# Patient Record
Sex: Female | Born: 1990 | Race: Black or African American | Hispanic: No | Marital: Single | Smoking: Never smoker
Health system: Southern US, Community
[De-identification: ages and names within clinical notes are randomized; demographics above are authoritative.]

---
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
## 2009-01-07 ENCOUNTER — Emergency Department (HOSPITAL_BASED_OUTPATIENT_CLINIC_OR_DEPARTMENT_OTHER): Admission: EM | Admit: 2009-01-07 | Discharge: 2009-01-07 | Payer: Self-pay | Admitting: Emergency Medicine

## 2010-02-04 ENCOUNTER — Emergency Department (HOSPITAL_COMMUNITY): Admission: EM | Admit: 2010-02-04 | Discharge: 2010-02-04 | Payer: Self-pay | Admitting: Emergency Medicine

## 2010-02-11 ENCOUNTER — Emergency Department (HOSPITAL_BASED_OUTPATIENT_CLINIC_OR_DEPARTMENT_OTHER): Admission: EM | Admit: 2010-02-11 | Discharge: 2010-02-12 | Payer: Self-pay | Admitting: Emergency Medicine

## 2010-04-02 ENCOUNTER — Emergency Department (HOSPITAL_BASED_OUTPATIENT_CLINIC_OR_DEPARTMENT_OTHER): Admission: EM | Admit: 2010-04-02 | Discharge: 2010-04-02 | Payer: Self-pay | Admitting: Emergency Medicine

## 2010-06-08 ENCOUNTER — Emergency Department (HOSPITAL_BASED_OUTPATIENT_CLINIC_OR_DEPARTMENT_OTHER): Admission: EM | Admit: 2010-06-08 | Discharge: 2010-06-08 | Payer: Self-pay | Admitting: Emergency Medicine

## 2010-07-08 ENCOUNTER — Emergency Department (HOSPITAL_BASED_OUTPATIENT_CLINIC_OR_DEPARTMENT_OTHER): Admission: EM | Admit: 2010-07-08 | Discharge: 2010-07-08 | Payer: Self-pay | Admitting: Emergency Medicine

## 2010-10-24 ENCOUNTER — Emergency Department (INDEPENDENT_AMBULATORY_CARE_PROVIDER_SITE_OTHER)
Admission: EM | Admit: 2010-10-24 | Discharge: 2010-10-24 | Payer: Self-pay | Source: Home / Self Care | Admitting: Emergency Medicine

## 2010-10-24 DIAGNOSIS — S81809A Unspecified open wound, unspecified lower leg, initial encounter: Secondary | ICD-10-CM

## 2010-10-24 DIAGNOSIS — X58XXXA Exposure to other specified factors, initial encounter: Secondary | ICD-10-CM

## 2010-11-05 ENCOUNTER — Emergency Department (HOSPITAL_BASED_OUTPATIENT_CLINIC_OR_DEPARTMENT_OTHER)
Admission: EM | Admit: 2010-11-05 | Discharge: 2010-11-05 | Disposition: A | Discharge status: Home / Self Care | Payer: Medicaid Other | Attending: Emergency Medicine | Admitting: Emergency Medicine

## 2010-11-05 DIAGNOSIS — Z4802 Encounter for removal of sutures: Secondary | ICD-10-CM | POA: Insufficient documentation

## 2010-12-06 LAB — PREGNANCY, URINE: Preg Test, Ur: NEGATIVE

## 2010-12-06 LAB — URINALYSIS, ROUTINE W REFLEX MICROSCOPIC
Bilirubin Urine: NEGATIVE
Ketones, ur: 15 mg/dL — AB
Protein, ur: NEGATIVE mg/dL
Specific Gravity, Urine: 1.026 (ref 1.005–1.030)

## 2010-12-07 LAB — WET PREP, GENITAL
Trich, Wet Prep: NONE SEEN
WBC, Wet Prep HPF POC: NONE SEEN
Yeast Wet Prep HPF POC: NONE SEEN

## 2010-12-07 LAB — GC/CHLAMYDIA PROBE AMP, GENITAL: Chlamydia, DNA Probe: NEGATIVE

## 2010-12-07 LAB — RAPID STREP SCREEN (MED CTR MEBANE ONLY): Streptococcus, Group A Screen (Direct): NEGATIVE

## 2010-12-10 LAB — URINE CULTURE: Colony Count: NO GROWTH

## 2010-12-10 LAB — HERPES SIMPLEX VIRUS CULTURE

## 2010-12-10 LAB — URINALYSIS, ROUTINE W REFLEX MICROSCOPIC
Glucose, UA: NEGATIVE mg/dL
Urobilinogen, UA: 1 mg/dL (ref 0.0–1.0)
pH: 5.5 (ref 5.0–8.0)

## 2010-12-10 LAB — WET PREP, GENITAL: WBC, Wet Prep HPF POC: NONE SEEN

## 2010-12-10 LAB — GC/CHLAMYDIA PROBE AMP, GENITAL: Chlamydia, DNA Probe: NEGATIVE

## 2010-12-11 LAB — GC/CHLAMYDIA PROBE AMP, GENITAL: GC Probe Amp, Genital: NEGATIVE

## 2010-12-11 LAB — WET PREP, GENITAL
Trich, Wet Prep: NONE SEEN
Yeast Wet Prep HPF POC: NONE SEEN

## 2010-12-11 LAB — PREGNANCY, URINE: Preg Test, Ur: NEGATIVE

## 2010-12-12 LAB — URINALYSIS, ROUTINE W REFLEX MICROSCOPIC
Nitrite: NEGATIVE
Urobilinogen, UA: 1 mg/dL (ref 0.0–1.0)

## 2011-01-03 LAB — URINALYSIS, ROUTINE W REFLEX MICROSCOPIC
Glucose, UA: NEGATIVE mg/dL
Nitrite: NEGATIVE
Specific Gravity, Urine: 1.029 (ref 1.005–1.030)

## 2011-01-03 LAB — WET PREP, GENITAL: Yeast Wet Prep HPF POC: NONE SEEN
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
## 2011-11-24 ENCOUNTER — Emergency Department (HOSPITAL_BASED_OUTPATIENT_CLINIC_OR_DEPARTMENT_OTHER)
Admission: EM | Admit: 2011-11-24 | Discharge: 2011-11-24 | Disposition: A | Discharge status: Home / Self Care | Payer: Medicaid Other

## 2011-11-24 NOTE — ED Notes (Signed)
Patient was registered, but when she was being taken to triage she stated that she needed to leave, but she "would be right back".

## 2014-05-14 ENCOUNTER — Emergency Department (HOSPITAL_BASED_OUTPATIENT_CLINIC_OR_DEPARTMENT_OTHER): Payer: Medicaid Other

## 2014-05-14 ENCOUNTER — Encounter (HOSPITAL_BASED_OUTPATIENT_CLINIC_OR_DEPARTMENT_OTHER): Payer: Self-pay | Admitting: Emergency Medicine

## 2014-05-14 ENCOUNTER — Emergency Department (HOSPITAL_BASED_OUTPATIENT_CLINIC_OR_DEPARTMENT_OTHER)
Admission: EM | Admit: 2014-05-14 | Discharge: 2014-05-14 | Disposition: A | Discharge status: Home / Self Care | Payer: Medicaid Other | Attending: Emergency Medicine | Admitting: Emergency Medicine

## 2014-05-14 DIAGNOSIS — S0990XA Unspecified injury of head, initial encounter: Secondary | ICD-10-CM | POA: Diagnosis not present

## 2014-05-14 DIAGNOSIS — Y92410 Unspecified street and highway as the place of occurrence of the external cause: Secondary | ICD-10-CM | POA: Diagnosis not present

## 2014-05-14 DIAGNOSIS — M542 Cervicalgia: Secondary | ICD-10-CM

## 2014-05-14 DIAGNOSIS — S8990XA Unspecified injury of unspecified lower leg, initial encounter: Secondary | ICD-10-CM | POA: Diagnosis not present

## 2014-05-14 DIAGNOSIS — S0993XA Unspecified injury of face, initial encounter: Secondary | ICD-10-CM | POA: Insufficient documentation

## 2014-05-14 DIAGNOSIS — S99929A Unspecified injury of unspecified foot, initial encounter: Secondary | ICD-10-CM

## 2014-05-14 DIAGNOSIS — S199XXA Unspecified injury of neck, initial encounter: Principal | ICD-10-CM

## 2014-05-14 DIAGNOSIS — Y9389 Activity, other specified: Secondary | ICD-10-CM | POA: Insufficient documentation

## 2014-05-14 DIAGNOSIS — IMO0002 Reserved for concepts with insufficient information to code with codable children: Secondary | ICD-10-CM | POA: Diagnosis not present

## 2014-05-14 DIAGNOSIS — S99919A Unspecified injury of unspecified ankle, initial encounter: Secondary | ICD-10-CM

## 2014-05-14 MED ORDER — HYDROCODONE-ACETAMINOPHEN 5-325 MG PO TABS
2.0000 | ORAL_TABLET | Freq: Once | ORAL | Status: AC
  Administered 2014-05-14: 2 via ORAL
  Filled 2014-05-14: qty 2

## 2014-05-14 MED ORDER — CYCLOBENZAPRINE HCL 10 MG PO TABS
5.0000 mg | ORAL_TABLET | Freq: Once | ORAL | Status: AC
  Administered 2014-05-14: 5 mg via ORAL
  Filled 2014-05-14: qty 1

## 2014-05-14 MED ORDER — CYCLOBENZAPRINE HCL 5 MG PO TABS: 10.0000 mg | ORAL_TABLET | Freq: Three times a day (TID) | ORAL | Status: DC | PRN

## 2014-05-14 MED ORDER — HYDROCODONE-ACETAMINOPHEN 5-325 MG PO TABS: 1.0000 | ORAL_TABLET | Freq: Four times a day (QID) | ORAL | Status: DC | PRN

## 2014-05-14 NOTE — Discharge Instructions (Signed)
Cervical Sprain °A cervical sprain is an injury in the neck in which the strong, fibrous tissues (ligaments) that connect your neck bones stretch or tear. Cervical sprains can range from mild to severe. Severe cervical sprains can cause the neck vertebrae to be unstable. This can lead to damage of the spinal cord and can result in serious nervous system problems. The amount of time it takes for a cervical sprain to get better depends on the cause and extent of the injury. Most cervical sprains heal in 1 to 3 weeks. °CAUSES  °Severe cervical sprains may be caused by:  °· Contact sport injuries (such as from football, rugby, wrestling, hockey, auto racing, gymnastics, diving, martial arts, or boxing).   °· Motor vehicle collisions.   °· Whiplash injuries. This is an injury from a sudden forward and backward whipping movement of the head and neck.  °· Falls.   °Mild cervical sprains may be caused by:  °· Being in an awkward position, such as while cradling a telephone between your ear and shoulder.   °· Sitting in a chair that does not offer proper support.   °· Working at a poorly designed computer station.   °· Looking up or down for long periods of time.   °SYMPTOMS  °· Pain, soreness, stiffness, or a burning sensation in the front, back, or sides of the neck. This discomfort may develop immediately after the injury or slowly, 24 hours or more after the injury.   °· Pain or tenderness directly in the middle of the back of the neck.   °· Shoulder or upper back pain.   °· Limited ability to move the neck.   °· Headache.   °· Dizziness.   °· Weakness, numbness, or tingling in the hands or arms.   °· Muscle spasms.   °· Difficulty swallowing or chewing.   °· Tenderness and swelling of the neck.   °DIAGNOSIS  °Most of the time your health care provider can diagnose a cervical sprain by taking your history and doing a physical exam. Your health care provider will ask about previous neck injuries and any known neck  problems, such as arthritis in the neck. X-rays may be taken to find out if there are any other problems, such as with the bones of the neck. Other tests, such as a CT scan or MRI, may also be needed.  °TREATMENT  °Treatment depends on the severity of the cervical sprain. Mild sprains can be treated with rest, keeping the neck in place (immobilization), and pain medicines. Severe cervical sprains are immediately immobilized. Further treatment is done to help with pain, muscle spasms, and other symptoms and may include: °· Medicines, such as pain relievers, numbing medicines, or muscle relaxants.   °· Physical therapy. This may involve stretching exercises, strengthening exercises, and posture training. Exercises and improved posture can help stabilize the neck, strengthen muscles, and help stop symptoms from returning.   °HOME CARE INSTRUCTIONS  °· Put ice on the injured area.   °¨ Put ice in a plastic bag.   °¨ Place a towel between your skin and the bag.   °¨ Leave the ice on for 15-20 minutes, 3-4 times a day.   °· If your injury was severe, you may have been given a cervical collar to wear. A cervical collar is a two-piece collar designed to keep your neck from moving while it heals. °¨ Do not remove the collar unless instructed by your health care provider. °¨ If you have long hair, keep it outside of the collar. °¨ Ask your health care provider before making any adjustments to your collar. Minor   adjustments may be required over time to improve comfort and reduce pressure on your chin or on the back of your head. °¨ If you are allowed to remove the collar for cleaning or bathing, follow your health care provider's instructions on how to do so safely. °¨ Keep your collar clean by wiping it with mild soap and water and drying it completely. If the collar you have been given includes removable pads, remove them every 1-2 days and hand wash them with soap and water. Allow them to air dry. They should be completely  dry before you wear them in the collar. °¨ If you are allowed to remove the collar for cleaning and bathing, wash and dry the skin of your neck. Check your skin for irritation or sores. If you see any, tell your health care provider. °¨ Do not drive while wearing the collar.   °· Only take over-the-counter or prescription medicines for pain, discomfort, or fever as directed by your health care provider.   °· Keep all follow-up appointments as directed by your health care provider.   °· Keep all physical therapy appointments as directed by your health care provider.   °· Make any needed adjustments to your workstation to promote good posture.   °· Avoid positions and activities that make your symptoms worse.   °· Warm up and stretch before being active to help prevent problems.   °SEEK MEDICAL CARE IF:  °· Your pain is not controlled with medicine.   °· You are unable to decrease your pain medicine over time as planned.   °· Your activity level is not improving as expected.   °SEEK IMMEDIATE MEDICAL CARE IF:  °· You develop any bleeding. °· You develop stomach upset. °· You have signs of an allergic reaction to your medicine.   °· Your symptoms get worse.   °· You develop new, unexplained symptoms.   °· You have numbness, tingling, weakness, or paralysis in any part of your body.   °MAKE SURE YOU:  °· Understand these instructions. °· Will watch your condition. °· Will get help right away if you are not doing well or get worse. °Document Released: 07/08/2007 Document Revised: 09/15/2013 Document Reviewed: 03/18/2013 °ExitCare® Patient Information ©2015 [employer]. This information is not intended to replace advice given to you by your health care provider. Make sure you discuss any questions you have with your health care provider. ° ° °Emergency Department Resource Guide °1) Find a Doctor and Pay Out of Pocket °Although you won't have to find out who is covered by your insurance plan, it is a good idea to ask  around and get recommendations. You will then need to call the office and see if the doctor you have chosen will accept you as a new patient and what types of options they offer for patients who are self-pay. Some doctors offer discounts or will set up payment plans for their patients who do not have insurance, but you will need to ask so you aren't surprised when you get to your appointment. ° °2) Contact Your Local Health Department °Not all health departments have doctors that can see patients for sick visits, but many do, so it is worth a call to see if yours does. If you don't know where your local health department is, you can check in your phone book. The CDC also has a tool to help you locate your state's health department, and many state websites also have listings of all of their local health departments. ° °3) Find a Walk-in Clinic °If your illness is not   likely to be very severe or complicated, you may want to try a walk in clinic. These are popping up all over the country in pharmacies, drugstores, and shopping centers. They're usually staffed by nurse practitioners or physician assistants that have been trained to treat common illnesses and complaints. They're usually fairly quick and inexpensive. However, if you have serious medical issues or chronic medical problems, these are probably not your best option. ° °No Primary Care Doctor: °- Call [facility] at  832-8000 - they can help you locate a primary care doctor that  accepts your insurance, provides certain services, etc. °- Physician Referral Service- 1-800-533-3463 ° °Chronic Pain Problems: °Organization         Address  Phone   Notes  °[facility]  (336) 297-2271 Patients need to be referred by their primary care doctor.  ° °Medication Assistance: °Organization         Address  Phone   Notes  °[place] [facility] [address] °[place], [place] [address] (336) 641-8030 --Must be a  resident of [place] °-- Must have NO insurance coverage whatsoever (no Medicaid/ Medicare, etc.) °-- The pt. MUST have a primary care doctor that directs their care regularly and follows them in the community °  °[facility]  (866) 331-1348   °[employer]  (888) 892-1162   ° °Agencies that provide inexpensive medical care: °Organization         Address  Phone   Notes  °[facility]  (336) 832-8035   °[facility]    (336) 832-7272   °[facility] [address] °[place], [place] [address] (336) 832-4777   °[facility] [address], °[place] (336) 271-4999   °[facility]    (336) 373-0678   °[facility]    (336) 272-1050   °[facility] ° [address], [place] Phone:  (336) 832-4444, Fax:  (336) 832-4440 Hours of Operation:  9 am - 6 pm, M-F.  Also accepts Medicaid/Medicare and self-pay.  °[facility] ° [address], [place] Phone: (336) 832-3150, Fax: (336) 832-3151. Hours of Operation:  8:30 am - 5:30 pm, M-F.  Also accepts Medicaid and self-pay.  °[facility] [place] [address], [place] Phone: (336) 878-6027   °[facility] [address], [place], [place] (336)723-1848, Ext. 123 Mondays & Thursdays: 7-9 AM.  First 15 patients are seen on a first come, first serve basis. °  ° °Medicaid-accepting [place] Providers: ° °Organization         Address  Phone   Notes  °[facility] [address], [place] (336) 641-2100 Also accepts self-pay patients.  °[facility] [address], [place] ° (336) 856-9996   °[facility] [address], [place] (336) 288-8857   °[facility] [address] [place] [address], [place] (336) 299-7000   °Veita Bland [address], [place]  ° (336) 373-1557 Only accepts  [place] Access Medicaid patients after they have their name applied to their card.  ° °Self-Pay (no insurance) in [place]: ° °Organization         Address  Phone   Notes  °Sickle Cell Patients, [facility] [address], [place] (336) 832-1970   °[facility] [address], [place] (336) 832-4400   °[facility] ° [address], [place] (336) 992-4800   °[facility]/Dr. Osei-Bonsu ° [address] [place] [address], [place] or [address], [place] (336) 841-8500 Phone number for both [place] and [place] locations is the same.  °[facility] [address], [place] (336) 299-0000   °[facility] [address] [place] [address], [place] or [address] (336) 852-7530 °(336) 878-2260   °[facility] [address], [place] (336) 350-1642, phone; (336) 294-5005, fax Sees patients 1st and 3rd Saturday of every month.  Must not qualify for public or private insurance (i.e. Medicaid, Medicare, [place] Health Choice, Veterans' Benefits) • Household income should be no more than 200% of the poverty level •The clinic cannot treat you if you are pregnant or think you are pregnant • Sexually transmitted diseases are not treated at the clinic.  ° ° °Dental Care: °Organization         Address  Phone  Notes  °[facility] [address], [place] (336) 641-6152 Accepts children up to age 21 who are enrolled in Medicaid or [place] Health Choice; pregnant women with a Medicaid card; and children who have applied for Medicaid or [place] Health Choice, but were declined, whose parents can pay a reduced fee at time of service.  °[facility] [place]  [address], [place] (336) 641-7733 Accepts children up to age 21 who are enrolled in Medicaid or [place] Health Choice; pregnant women  with a Medicaid card; and children who have applied for Medicaid or [place] Health Choice, but were declined, whose parents can pay a reduced fee at time of service.  °[facility] ° [address], [place] (336) 641-4533 Patients are seen by appointment only. Walk-ins are not accepted. [facility] will see patients 18 years of age and older. °Monday - Tuesday (8am-5pm) °Most Wednesdays (8:30-5pm) °$30 per visit, cash only  °[facility] ° [address], [place] (336) 641-4533 Patients are seen by appointment only. Walk-ins are not accepted. [facility] will see patients 18 years of age and older. °One Wednesday Evening (Monthly: Volunteer Based).  $30 per visit, cash only  °[facility]  (919) 537-3737 for adults; Children under age 4, call Graduate Pediatric Dentistry at (919) [phone]. Children aged 4-14, please call (919) [phone] to request a pediatric application. ° Dental services are provided in all areas of dental care including fillings, crowns and bridges, complete and partial dentures, implants, gum treatment, root canals, and extractions. Preventive care is also provided. Treatment is provided to both adults and children. °Patients are selected via a lottery and there is often a waiting list. °  °[facility] [address], °[place] ° (336) 763-8833 [email or web address] °  °[facility] [address], [place], [place] (336)723-1848, Ext. 123 Second and Fourth Thursday of each month, opens at 6:30 AM; Clinic ends at 9 AM.  Patients are seen on a first-come first-served basis, and a limited number are seen during each clinic.  ° °[facility] ° [address], [place], [place] (336) 723-7904   Eligibility Requirements °You must have lived in [place], [place], or [place] counties for at least the last three months. °    You cannot be eligible for state or federal sponsored healthcare  insurance, including Veterans Administration, Medicaid, or Medicare. °  You generally cannot be eligible for healthcare insurance through your employer.  °  How to apply: °Eligibility screenings are held every Tuesday and Wednesday afternoon from 1:00 pm until 4:00 pm. You do not need an appointment for the interview!  °[facility] [address], [place], [place] 336-631-2330   °[facility]  336-342-8273   °[facility]  336-703-3100   °[facility]  336-570-6415   ° °Behavioral Health Resources in the Community: °Intensive Outpatient Programs °Organization         Address  Phone  Notes  °[facility] [address], [place], [place] 336-878-6098   °[facility] [address], [place], [place] 336-832-9800   °[facility] [address], [place], [place] ° 336-882-2125   °[facility] [address],  °[place], [place] 1-800-853-5163 or 336-641-4981   °Substance Abuse Resources °Organization         Address  Phone  Notes  °[facility]  336-882-2125   °[facility]  336-784-9470   °[facility]  336-285-9073   °[facility]  336-845-3988   °Residential & Outpatient Substance Abuse Program  1-800-659-3381   °Psychological Services °Organization         Address  Phone  Notes  °[facility]  336- 832-9600   °[facility]  336- 378-7881   °[facility] [address], [place] 1-800-853-5163 or 336-641-4981   ° °Mobile Crisis Teams °Organization         Address  Phone  Notes  °[facility], Mobile Crisis Care Unit  1-877-626-1772   °Assertive °[facility] ° [address] [place], [place] 336-834-9664   °Sharon DeEsch [address] °[place] 336-554-5454   ° °Self-Help/Support Groups °Organization         Address  Phone             Notes  °[facility] - variety of support groups  336- 373-1402 Call for more information  °Narcotics Anonymous (NA), [facility] [address], °[place]  2 meetings at this location  ° °Residential Treatment Programs °Organization         Address  Phone  Notes  °[facility] [address],    °[place]  1-866-801-8205   °[facility] ° [address], [place], [place] 704-293-8524   °[facility] [address], [place] 336-845-3988 Admissions: 8am-3pm M-F  °[facility] [address]    °[place], [place] 336-841-1104   °[facility] [address], [place], [place] 336-379-7146   °[facility] [address]  °, [place] 336-285-9073   °[facility] - Intensive Outpatient [address], , [place] 336-852-3033   °[facility] [address]  °[place], [place] 1-877-615-2722 or 336-784-9470   °[facility] [address] [place], [place] 336-227-7417 Accepts Medicaid  °[facility] [address]  ° [place] 1-800-659-3381 Substance Abuse/Addiction Treatment  ° °[place] Behavioral Health Resources °Organization         Address  Phone  Notes  °[facility]  (888) 581-9988   °Julie Brannon, PhD [address] [place], [place]   (336) 349-5553 or (336) 951-0000   ° Behavioral   [address] °[place], [place] (336) 349-4454   °[facility] [address], [place], [place] (336) 342-8316 Insurance/Medicaid/sponsorship through [facility]  °[facility] [address]                                    [place], [place] (336) 342-8316 Therapy/tele-psych/case  °[facility] [address]  ° [place], [place] (336) 349-2233    °Dr. Arfeen  (336) 349-4544   °[facility] [place]  [employer] [place] [facility] 1) [address],  [place] °2) [address], [place] °3)  [address], [place] (336) 349-3220 °(336) 342-7768 ° °(336) 342-8140   °[place] [facility] (336) 342-1394 or (336) 342-3537 (After Hours)    ° ° ° °
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
## 2014-12-01 ENCOUNTER — Encounter (HOSPITAL_BASED_OUTPATIENT_CLINIC_OR_DEPARTMENT_OTHER): Payer: Self-pay | Admitting: *Deleted

## 2014-12-01 ENCOUNTER — Emergency Department (HOSPITAL_BASED_OUTPATIENT_CLINIC_OR_DEPARTMENT_OTHER): Payer: Medicaid Other

## 2014-12-01 ENCOUNTER — Emergency Department (HOSPITAL_BASED_OUTPATIENT_CLINIC_OR_DEPARTMENT_OTHER)
Admission: EM | Admit: 2014-12-01 | Discharge: 2014-12-01 | Disposition: A | Discharge status: Home / Self Care | Payer: Medicaid Other | Attending: Emergency Medicine | Admitting: Emergency Medicine

## 2014-12-01 DIAGNOSIS — Z3A01 Less than 8 weeks gestation of pregnancy: Secondary | ICD-10-CM | POA: Insufficient documentation

## 2014-12-01 DIAGNOSIS — B9689 Other specified bacterial agents as the cause of diseases classified elsewhere: Secondary | ICD-10-CM

## 2014-12-01 DIAGNOSIS — R109 Unspecified abdominal pain: Secondary | ICD-10-CM

## 2014-12-01 DIAGNOSIS — Z79899 Other long term (current) drug therapy: Secondary | ICD-10-CM | POA: Insufficient documentation

## 2014-12-01 DIAGNOSIS — O23591 Infection of other part of genital tract in pregnancy, first trimester: Secondary | ICD-10-CM | POA: Insufficient documentation

## 2014-12-01 DIAGNOSIS — O9989 Other specified diseases and conditions complicating pregnancy, childbirth and the puerperium: Secondary | ICD-10-CM | POA: Diagnosis present

## 2014-12-01 DIAGNOSIS — Z3401 Encounter for supervision of normal first pregnancy, first trimester: Secondary | ICD-10-CM

## 2014-12-01 DIAGNOSIS — N760 Acute vaginitis: Secondary | ICD-10-CM

## 2014-12-01 LAB — URINALYSIS, ROUTINE W REFLEX MICROSCOPIC
BILIRUBIN URINE: NEGATIVE
Glucose, UA: NEGATIVE mg/dL
HGB URINE DIPSTICK: NEGATIVE
Ketones, ur: 15 mg/dL — AB
Leukocytes, UA: NEGATIVE
Nitrite: NEGATIVE
PH: 7.5 (ref 5.0–8.0)
Protein, ur: NEGATIVE mg/dL
Specific Gravity, Urine: 1.018 (ref 1.005–1.030)
Urobilinogen, UA: 1 mg/dL (ref 0.0–1.0)

## 2014-12-01 LAB — CBC WITH DIFFERENTIAL/PLATELET
BASOS ABS: 0 10*3/uL (ref 0.0–0.1)
Basophils Relative: 0 % (ref 0–1)
EOS ABS: 0.1 10*3/uL (ref 0.0–0.7)
EOS PCT: 2 % (ref 0–5)
HCT: 35.1 % — ABNORMAL LOW (ref 36.0–46.0)
Hemoglobin: 12.2 g/dL (ref 12.0–15.0)
LYMPHS ABS: 1.7 10*3/uL (ref 0.7–4.0)
LYMPHS PCT: 31 % (ref 12–46)
MCH: 31.4 pg (ref 26.0–34.0)
MCHC: 34.8 g/dL (ref 30.0–36.0)
MCV: 90.2 fL (ref 78.0–100.0)
MONO ABS: 0.7 10*3/uL (ref 0.1–1.0)
MONOS PCT: 12 % (ref 3–12)
NEUTROS PCT: 55 % (ref 43–77)
Neutro Abs: 3 10*3/uL (ref 1.7–7.7)
Platelets: 343 10*3/uL (ref 150–400)
RBC: 3.89 MIL/uL (ref 3.87–5.11)
RDW: 11.2 % — ABNORMAL LOW (ref 11.5–15.5)
WBC: 5.5 10*3/uL (ref 4.0–10.5)

## 2014-12-01 LAB — WET PREP, GENITAL
Trich, Wet Prep: NONE SEEN
YEAST WET PREP: NONE SEEN

## 2014-12-01 LAB — HCG, QUANTITATIVE, PREGNANCY: hCG, Beta Chain, Quant, S: 63972 m[IU]/mL — ABNORMAL HIGH (ref <5)

## 2014-12-01 LAB — PREGNANCY, URINE: PREG TEST UR: POSITIVE — AB

## 2014-12-01 MED ORDER — PRENATAL COMPLETE 14-0.4 MG PO TABS: 1.0000 | ORAL_TABLET | Freq: Every day | ORAL | Status: DC

## 2014-12-01 MED ORDER — METRONIDAZOLE 500 MG PO TABS: 500.0000 mg | ORAL_TABLET | Freq: Two times a day (BID) | ORAL | Status: DC

## 2014-12-01 MED ORDER — ACETAMINOPHEN 325 MG PO TABS
650.0000 mg | ORAL_TABLET | Freq: Once | ORAL | Status: AC
Start: 2014-12-01 — End: 2014-12-01
  Administered 2014-12-01: 650 mg via ORAL
  Filled 2014-12-01: qty 2

## 2014-12-01 MED ORDER — METRONIDAZOLE 500 MG PO TABS
500.0000 mg | ORAL_TABLET | Freq: Once | ORAL | Status: AC
  Administered 2014-12-01: 500 mg via ORAL
  Filled 2014-12-01: qty 1

## 2014-12-01 MED ORDER — SODIUM CHLORIDE 0.9 % IV BOLUS (SEPSIS)
1000.0000 mL | Freq: Once | INTRAVENOUS | Status: AC
  Administered 2014-12-01: 1000 mL via INTRAVENOUS

## 2014-12-01 NOTE — Discharge Instructions (Signed)
Follow with OB/GYN as soon as possible.   Do NOT take any NSAIDs, such as Aspirin, Motrin, Ibuprofen, Aleve, Naproxen etc. Only take Tylenol for pain. Return to the emergency room  for any severe abdominal pain, increasing vaginal bleeding, passing out or repeated vomiting.    Abdominal Pain During Pregnancy Abdominal pain is common in pregnancy. Most of the time, it does not cause harm. There are many causes of abdominal pain. Some causes are more serious than others. Some of the causes of abdominal pain in pregnancy are easily diagnosed. Occasionally, the diagnosis takes time to understand. Other times, the cause is not determined. Abdominal pain can be a sign that something is very wrong with the pregnancy, or the pain may have nothing to do with the pregnancy at all. For this reason, always tell your health care provider if you have any abdominal discomfort. HOME CARE INSTRUCTIONS  Monitor your abdominal pain for any changes. The following actions may help to alleviate any discomfort you are experiencing:  Do not have sexual intercourse or put anything in your vagina until your symptoms go away completely.  Get plenty of rest until your pain improves.  Drink clear fluids if you feel nauseous. Avoid solid food as long as you are uncomfortable or nauseous.  Only take over-the-counter or prescription medicine as directed by your health care provider.  Keep all follow-up appointments with your health care provider. SEEK IMMEDIATE MEDICAL CARE IF:  You are bleeding, leaking fluid, or passing tissue from the vagina.  You have increasing pain or cramping.  You have persistent vomiting.  You have painful or bloody urination.  You have a fever.  You notice a decrease in your baby's movements.  You have extreme weakness or feel faint.  You have shortness of breath, with or without abdominal pain.  You develop a severe headache with abdominal pain.  You have abnormal vaginal  discharge with abdominal pain.  You have persistent diarrhea.  You have abdominal pain that continues even after rest, or gets worse. MAKE SURE YOU:   Understand these instructions.  Will watch your condition.  Will get help right away if you are not doing well or get worse. Document Released: 09/10/2005 Document Revised: 07/01/2013 Document Reviewed: 04/09/2013 [facility] Patient Information 2015 [employer] [place]. This information is not intended to replace advice given to you by your health care provider. Make sure you discuss any questions you have with your health care provider.

## 2014-12-01 NOTE — ED Notes (Signed)
PA at bedside talking with pt.

## 2014-12-01 NOTE — ED Notes (Signed)
Pt c/o intermittent abd pain since yesterday. She sts she also has a HA. Denies N/V/D

## 2014-12-01 NOTE — ED Notes (Signed)
I-STAT chem 8 results given to MD. Results are not crossing over at this time. POC contacted.

## 2014-12-01 NOTE — ED Provider Notes (Signed)
CSN: [id number]     Arrival date & time 12/01/14  1443 History   None    Chief Complaint  Patient presents with  . Abdominal Pain     (Consider location/radiation/quality/duration/timing/severity/associated sxs/prior Treatment) HPI   Peggy Reed is a 24 y.o. female complaining of severe lower midline abdominal pain rated at 10 out of 10 onset last night she did not syncopized but she states the pain is so severe that brought her to her knees. She states that she took unknown over-the-counter pain medication and pain has reduced to 6 out of 10. Her last menstrual period was at the end of January. She's had no spotting since that time. She's never been pregnant before. She denies dysuria, hematuria, reduced urinary output, abnormal vaginal discharge  History reviewed. No pertinent past medical history. History reviewed. No pertinent past surgical history. No family history on file. History  Substance Use Topics  . Smoking status: Never Smoker   . Smokeless tobacco: Not on file  . Alcohol Use: No   OB History    No data available     Review of Systems  10 systems reviewed and found to be negative, except as noted in the HPI.   Allergies  Review of patient's allergies indicates no known allergies.  Home Medications   Prior to Admission medications   Medication Sig Start Date End Date Taking? Authorizing Provider  cyclobenzaprine (FLEXERIL) 5 MG tablet Take 2 tablets (10 mg total) by mouth 3 (three) times daily as needed for muscle spasms. 05/14/14   Elwin MochaBlair Walden, MD  HYDROcodone-acetaminophen (NORCO/VICODIN) 5-325 MG per tablet Take 1 tablet by mouth every 6 (six) hours as needed for moderate pain. 05/14/14   Elwin MochaBlair Walden, MD  metroNIDAZOLE (FLAGYL) 500 MG tablet Take 1 tablet (500 mg total) by mouth 2 (two) times daily. 12/01/14   Magenta Schmiesing, PA-C  Prenatal Vit-Fe Fumarate-FA (PRENATAL COMPLETE) 14-0.4 MG TABS Take 1 tablet by mouth daily. 12/01/14   Moneka Mcquinn, PA-C    BP 108/54 mmHg  Pulse 74  Temp(Src) 98.7 F (37.1 C) (Oral)  Resp 18  Ht 4\' 11"  (1.499 m)  Wt 125 lb (56.7 kg)  BMI 25.23 kg/m2  SpO2 100%  LMP 10/20/2014 (Approximate) Physical Exam  Constitutional: She is oriented to person, place, and time. She appears well-developed and well-nourished. No distress.  HENT:  Head: Normocephalic and atraumatic.  Mouth/Throat: Oropharynx is clear and moist.  Eyes: Conjunctivae and EOM are normal. Pupils are equal, round, and reactive to light.  Neck: Normal range of motion.  Cardiovascular: Normal rate and regular rhythm.   Pulmonary/Chest: Effort normal and breath sounds normal. No stridor.  Abdominal: Soft. Bowel sounds are normal. She exhibits no distension and no mass. There is no tenderness. There is no rebound and no guarding.  Genitourinary:  No rashes or lesions. Clitoral piercing noted. No signs of infection. Scant white vaginal discharged, non foul smelling. No cervical motion or adnexal tenderness. Cervical os is closed.  Musculoskeletal: Normal range of motion.  Neurological: She is alert and oriented to person, place, and time.  Psychiatric: She has a normal mood and affect.  Nursing note and vitals reviewed.   ED Course  Procedures (including critical care time) Labs Review Labs Reviewed  WET PREP, GENITAL - Abnormal; Notable for the following:    Clue Cells Wet Prep HPF POC MODERATE (*)    WBC, Wet Prep HPF POC FEW (*)    All other components within normal limits  PREGNANCY,  URINE - Abnormal; Notable for the following:    Preg Test, Ur POSITIVE (*)    All other components within normal limits  URINALYSIS, ROUTINE W REFLEX MICROSCOPIC - Abnormal; Notable for the following:    Ketones, ur 15 (*)    All other components within normal limits  CBC WITH DIFFERENTIAL/PLATELET - Abnormal; Notable for the following:    HCT 35.1 (*)    RDW 11.2 (*)    All other components within normal limits  HCG, QUANTITATIVE, PREGNANCY -  Abnormal; Notable for the following:    hCG, Beta Chain, Quant, [place] [address] (*)    All other components within normal limits  RPR  HIV ANTIBODY (ROUTINE TESTING)  I-STAT CHEM 8, ED  GC/CHLAMYDIA PROBE AMP ([place])    Imaging Review US Ob Comp Less 14 Wks  12/01/2014   CLINICAL DATA:  Mid pelvic discomfort for 2 days.  EXAM: OBSTETRIC <14 WK [place] AND TRANSVAGINAL OB US  TECHNIQUE: Both transabdominal and transvaginal ultrasound examinations were performed for complete evaluation of the gestation as well as the maternal uterus, adnexal regions, and pelvic cul-de-sac. Transvaginal technique was performed to assess early pregnancy.  COMPARISON:  None.  FINDINGS: Intrauterine gestational sac: Visualized/normal in shape.  Yolk sac:  Present  Embryo:  Present  Cardiac Activity: Present  Heart Rate: 118  bpm  CRL:  5.4  mm   6 w   2 d                  [place] EDC: 07/25/2015  Maternal uterus/adnexae: There is an intrauterine gestational sac with yolk sac and fetal pole. Question a small subchorionic hemorrhage. There is a heterogeneous structure in the left ovary measuring 2.5 x 1.5 x 1.3 cm and most compatible with a corpus luteum. Otherwise, normal appearance of the ovaries. Trace free fluid.  IMPRESSION: Single intrauterine pregnancy. Calculated gestational age is 6 weeks and 2 days.   Electronically Signed   By: Richarda Overlie M.D.   On: 12/01/2014 17:02   US Ob Transvaginal  12/01/2014   CLINICAL DATA:  Mid pelvic discomfort for 2 days.  EXAM: OBSTETRIC <14 WK [place] AND TRANSVAGINAL OB US  TECHNIQUE: Both transabdominal and transvaginal ultrasound examinations were performed for complete evaluation of the gestation as well as the maternal uterus, adnexal regions, and pelvic cul-de-sac. Transvaginal technique was performed to assess early pregnancy.  COMPARISON:  None.  FINDINGS: Intrauterine gestational sac: Visualized/normal in shape.  Yolk sac:  Present  Embryo:  Present  Cardiac Activity: Present  Heart Rate: 118   bpm  CRL:  5.4  mm   6 w   2 d                  [place] EDC: 07/25/2015  Maternal uterus/adnexae: There is an intrauterine gestational sac with yolk sac and fetal pole. Question a small subchorionic hemorrhage. There is a heterogeneous structure in the left ovary measuring 2.5 x 1.5 x 1.3 cm and most compatible with a corpus luteum. Otherwise, normal appearance of the ovaries. Trace free fluid.  IMPRESSION: Single intrauterine pregnancy. Calculated gestational age is 6 weeks and 2 days.   Electronically Signed   By: Richarda Overlie M.D.   On: 12/01/2014 17:02     EKG Interpretation None      MDM   Final diagnoses:  Pregnancy, first, first trimester  Bacterial vaginosis    Filed Vitals:   12/01/14 1452 12/01/14 1655 12/01/14 1737 12/01/14 1808  BP:  112/65 113/66 106/44 108/54  Pulse: 76 77 93 74  Temp: 98.7 F (37.1 C)     TempSrc: Oral     Resp: 16   18  Height:  (1.499 m)     Weight: 125 lb (56.7 kg)     SpO2: 100% 99% 100% 100%    Medications  acetaminophen (TYLENOL) tablet 650 mg (650 mg Oral Given 12/01/14 1615)  sodium chloride 0.9 % bolus 1,000 mL (0 mLs Intravenous Stopped 12/01/14 1806)  metroNIDAZOLE (FLAGYL) tablet 500 mg (500 mg Oral Given 12/01/14 1809)    Peggy Reed is a pleasant 24 y.o. female presenting with severe lower midline abdominal pain. Abdominal exam is nonsurgical. Urine pregnancy is found to be positive. Ultrasound shows single IUP at 6 weeks and 2 days. Pelvic exam with no gross abnormality, there is a moderate amount of clue cells. Patient will be started for treatment of BV with Flagyl. I will also give her prescription for prenatal vitamins. We've had a discussion about only using acetaminophen for pain control. Patient will be referred to [facility] hospital. With adnexa discussion of return precautions.   Evaluation does not show pathology that would require ongoing emergent intervention or inpatient treatment. Pt is hemodynamically stable and mentating  appropriately. Discussed findings and plan with patient/guardian, who agrees with care plan. All questions answered. Return precautions discussed and outpatient follow up given.        Wynetta Emery, PA-C 12/01/14 2211  Elwin Mocha, MD 12/01/14 [phone]

## 2014-12-02 LAB — I-STAT CHEM 8, ED
BUN: 7 mg/dL (ref 6–23)
CHLORIDE: 101 mmol/L (ref 96–112)
Calcium, Ion: 1.17 mmol/L (ref 1.12–1.23)
Creatinine, Ser: 0.8 mg/dL (ref 0.50–1.10)
GLUCOSE: 71 mg/dL (ref 70–99)
POTASSIUM: 3.8 mmol/L (ref 3.5–5.1)
Sodium: 137 mmol/L (ref 135–145)
TCO2: 21 mmol/L (ref 0–100)

## 2014-12-02 LAB — HIV ANTIBODY (ROUTINE TESTING W REFLEX): HIV Screen 4th Generation wRfx: NONREACTIVE

## 2014-12-02 LAB — GC/CHLAMYDIA PROBE AMP (~~LOC~~) NOT AT ARMC
CHLAMYDIA, DNA PROBE: NEGATIVE
NEISSERIA GONORRHEA: NEGATIVE

## 2014-12-02 LAB — RPR: RPR Ser Ql: NONREACTIVE

## 2014-12-14 NOTE — ED Notes (Signed)
[facility] @ [phone], requested changing Prenatel vitamins from Prenatal Complete to Prenatel Plus. Chart reviewed with Dr. Littie DeedsGentry, [facility].
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
## 2016-03-02 IMAGING — US US OB TRANSVAGINAL
1 series · 14 of 28 positions shown · non-contrast
Comparison: None.

CLINICAL DATA: Mid pelvic discomfort for 2 days.

EXAM:
OBSTETRIC <14 WK US AND TRANSVAGINAL OB US
TECHNIQUE: Both transabdominal and transvaginal ultrasound examinations were
performed for complete evaluation of the gestation as well as the
maternal uterus, adnexal regions, and pelvic cul-de-sac.
Transvaginal technique was performed to assess early pregnancy.

[Series 1: us ob transvaginal · 0.15mm/px · 14 of 72 slices shown]
[im 3/72]
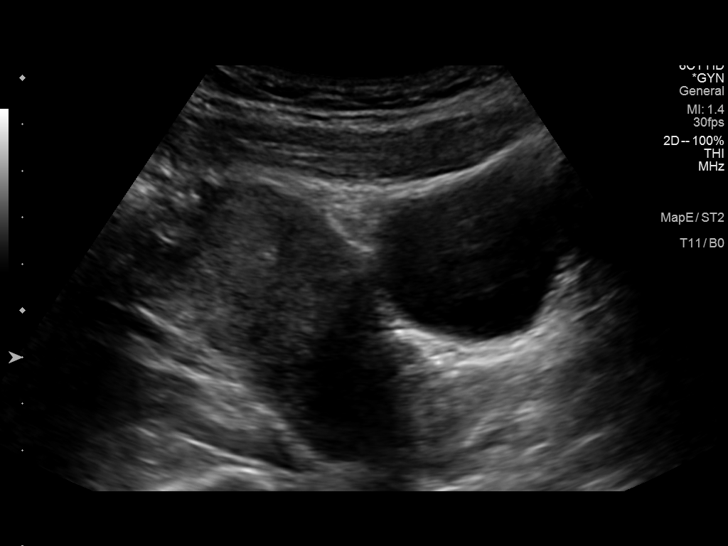
[im 8/72]
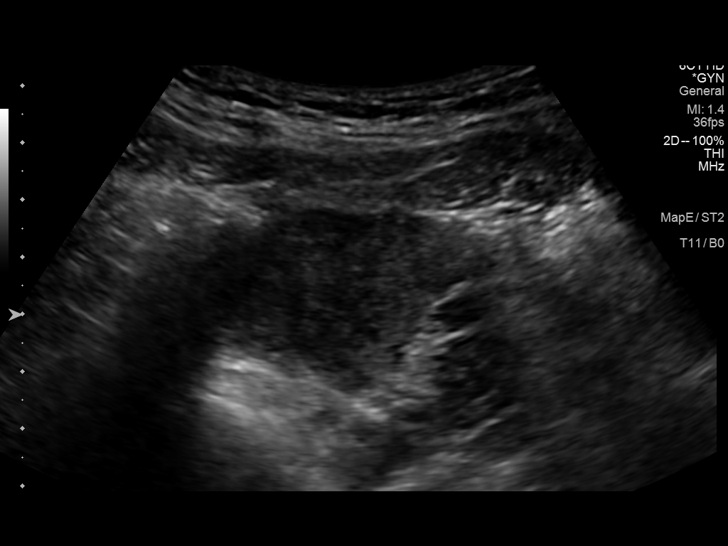
[im 14/72]
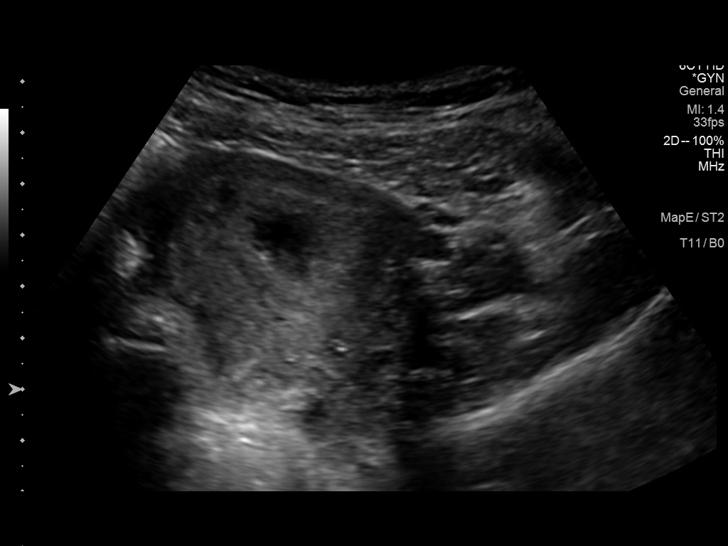
[im 19/72]
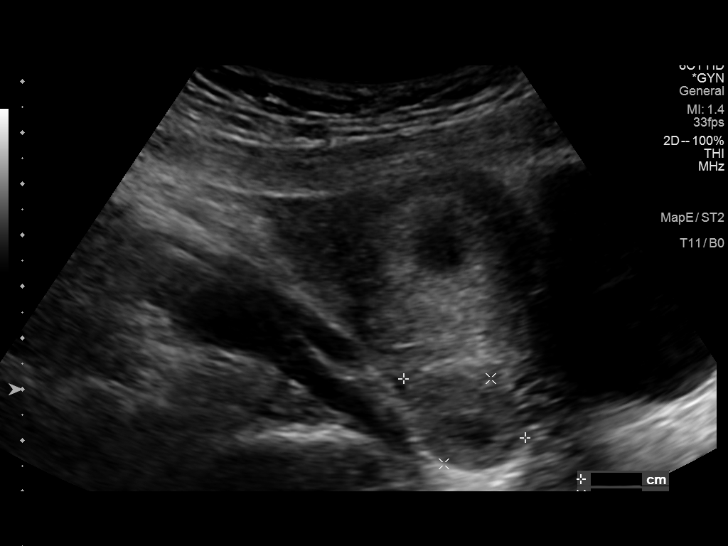
[im 24/72]
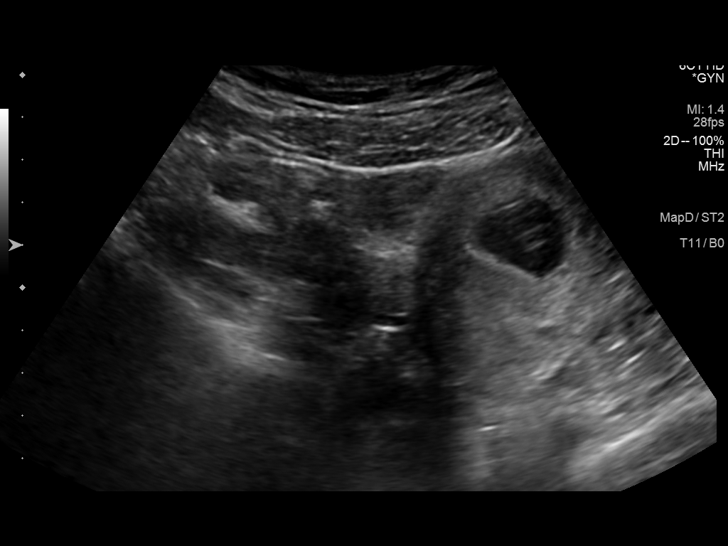
[im 29/72]
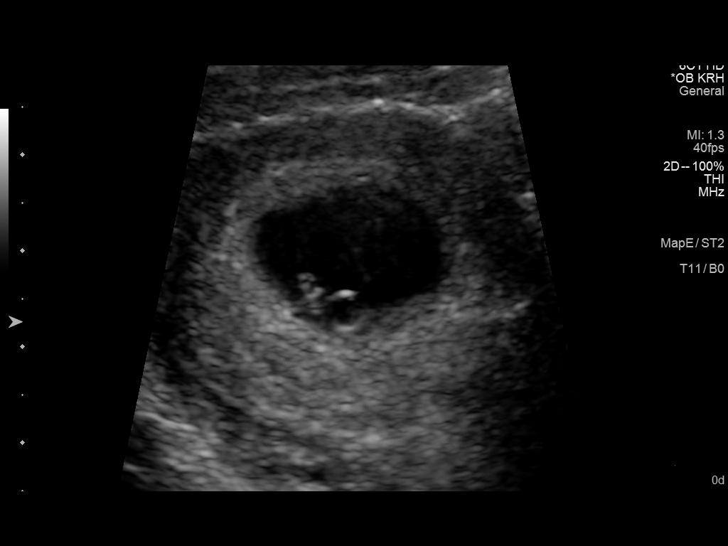
[im 35/72]
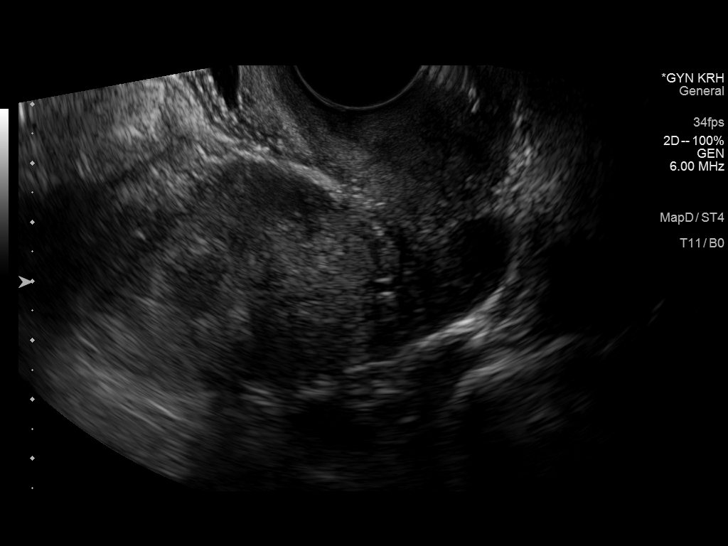
[im 40/72]
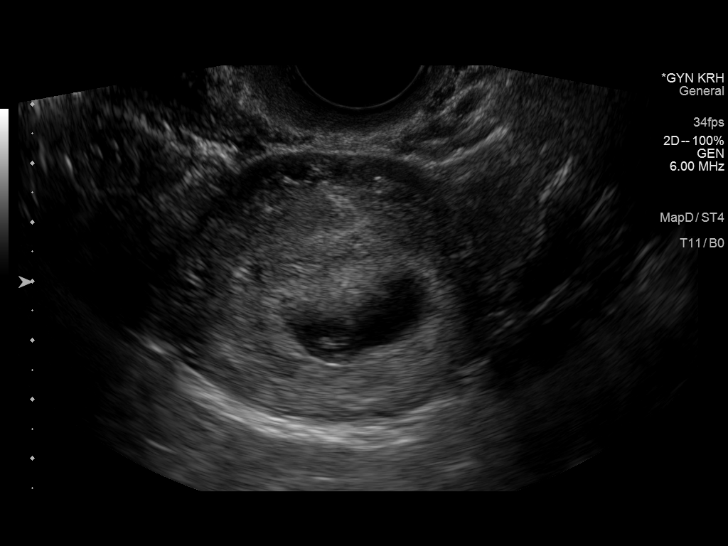
[im 45/72]
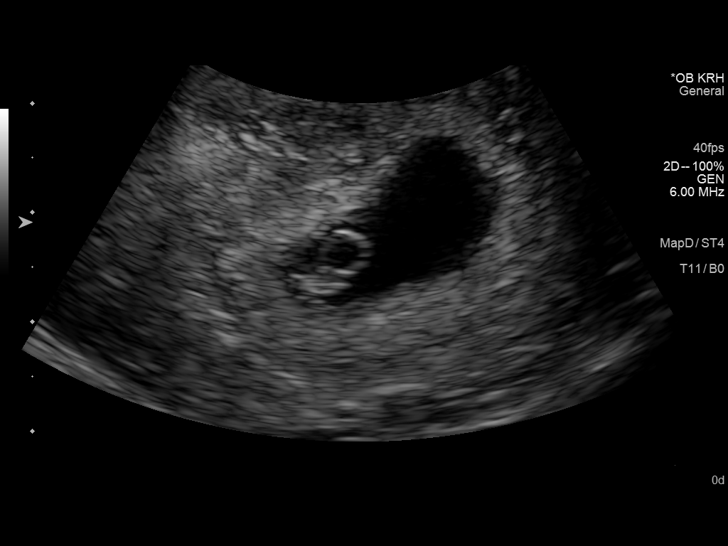
[im 50/72]
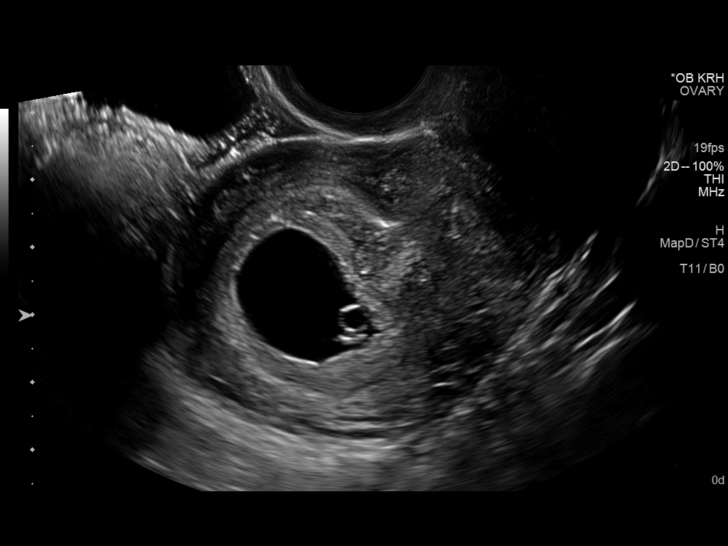
[im 56/72]
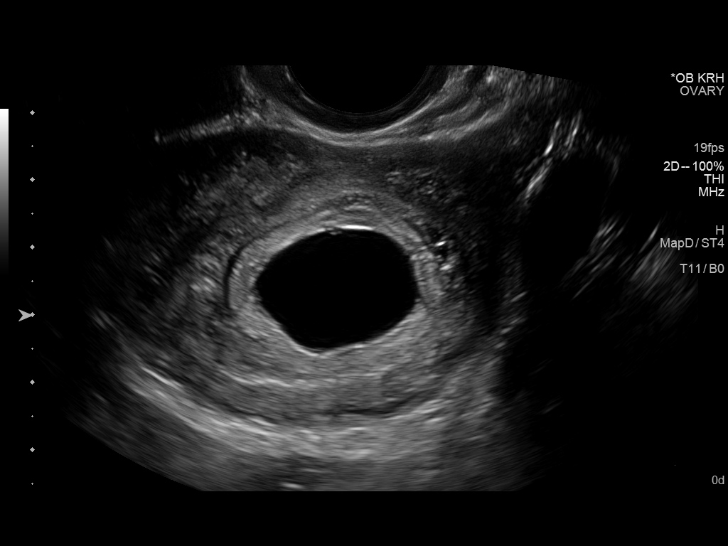
[im 61/72]
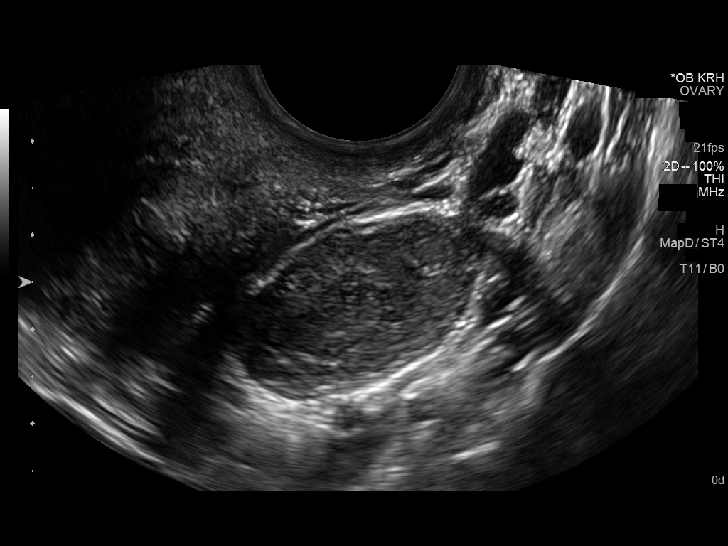
[im 66/72]
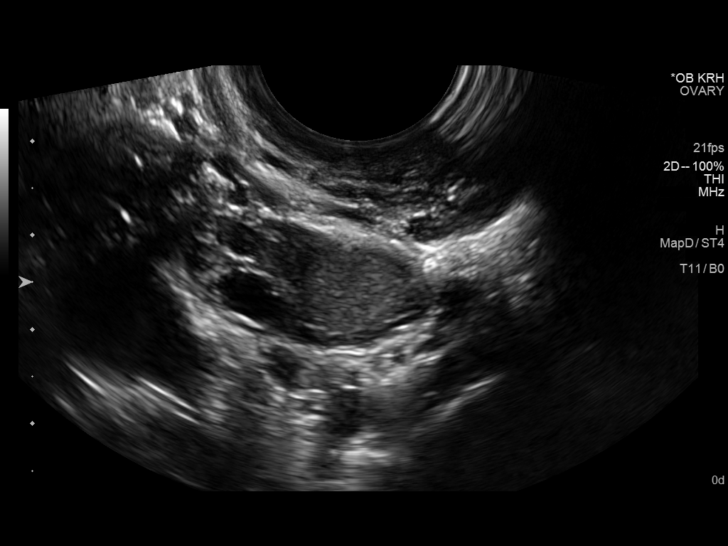
[im 72/72]
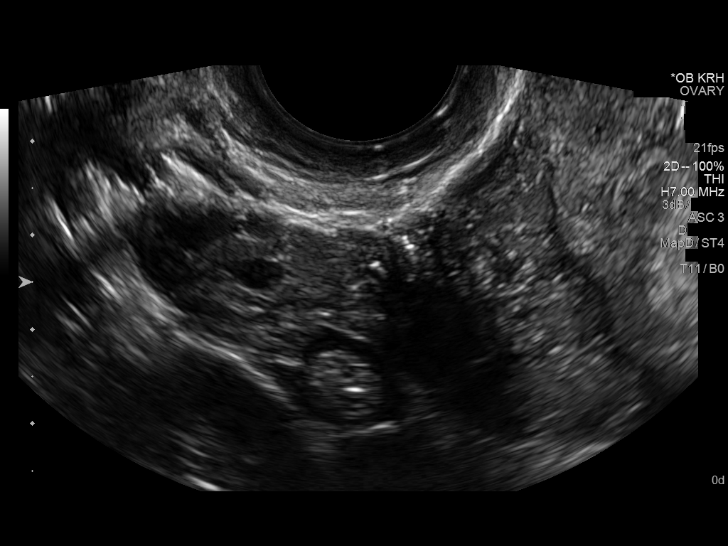

[14 of 28 positions shown; findings below may reference images not displayed]

FINDINGS: Intrauterine gestational sac: Visualized/normal in shape.

Yolk sac:  Present

Embryo:  Present

Cardiac Activity: Present

Heart Rate: 118  bpm

CRL:  5.4  mm   6 w   2 d                  US EDC: 07/25/2015

Maternal uterus/adnexae: There is an intrauterine gestational sac
with yolk sac and fetal pole. Question a small subchorionic
hemorrhage. There is a heterogeneous structure in the left ovary
measuring 2.5 x 1.5 x 1.3 cm and most compatible with a corpus
luteum. Otherwise, normal appearance of the ovaries. Trace free
fluid.
IMPRESSION: Single intrauterine pregnancy. Calculated gestational age is 6 weeks
and 2 days.

## 2017-03-10 ENCOUNTER — Encounter (HOSPITAL_BASED_OUTPATIENT_CLINIC_OR_DEPARTMENT_OTHER): Payer: Self-pay

## 2017-03-10 ENCOUNTER — Emergency Department (HOSPITAL_BASED_OUTPATIENT_CLINIC_OR_DEPARTMENT_OTHER)
Admission: EM | Admit: 2017-03-10 | Discharge: 2017-03-10 | Disposition: A | Discharge status: Home / Self Care | Payer: Medicaid Other | Attending: Emergency Medicine | Admitting: Emergency Medicine

## 2017-03-10 DIAGNOSIS — B9689 Other specified bacterial agents as the cause of diseases classified elsewhere: Secondary | ICD-10-CM

## 2017-03-10 DIAGNOSIS — H1132 Conjunctival hemorrhage, left eye: Secondary | ICD-10-CM | POA: Insufficient documentation

## 2017-03-10 DIAGNOSIS — N898 Other specified noninflammatory disorders of vagina: Secondary | ICD-10-CM | POA: Insufficient documentation

## 2017-03-10 DIAGNOSIS — N760 Acute vaginitis: Secondary | ICD-10-CM

## 2017-03-10 LAB — WET PREP, GENITAL
SPERM: NONE SEEN
TRICH WET PREP: NONE SEEN
YEAST WET PREP: NONE SEEN

## 2017-03-10 LAB — URINALYSIS, ROUTINE W REFLEX MICROSCOPIC
Bilirubin Urine: NEGATIVE
GLUCOSE, UA: NEGATIVE mg/dL
HGB URINE DIPSTICK: NEGATIVE
KETONES UR: NEGATIVE mg/dL
Leukocytes, UA: NEGATIVE
Nitrite: NEGATIVE
Protein, ur: NEGATIVE mg/dL
SPECIFIC GRAVITY, URINE: 1.021 (ref 1.005–1.030)
pH: 7 (ref 5.0–8.0)

## 2017-03-10 LAB — PREGNANCY, URINE: Preg Test, Ur: NEGATIVE

## 2017-03-10 MED ORDER — TETRACAINE HCL 0.5 % OP SOLN
2.0000 [drp] | Freq: Once | OPHTHALMIC | Status: DC
  Filled 2017-03-10: qty 4

## 2017-03-10 MED ORDER — TETRACAINE HCL 0.5 % OP SOLN: 1.0000 [drp] | Freq: Once | OPHTHALMIC | Status: DC

## 2017-03-10 MED ORDER — METRONIDAZOLE 500 MG PO TABS
500.0000 mg | ORAL_TABLET | Freq: Once | ORAL | Status: AC
  Administered 2017-03-10: 500 mg via ORAL
  Filled 2017-03-10: qty 1

## 2017-03-10 MED ORDER — FLUORESCEIN SODIUM 0.6 MG OP STRP
1.0000 | ORAL_STRIP | Freq: Once | OPHTHALMIC | Status: DC
  Filled 2017-03-10: qty 1

## 2017-03-10 MED ORDER — METRONIDAZOLE 500 MG PO TABS: 500.0000 mg | ORAL_TABLET | Freq: Two times a day (BID) | ORAL | 0 refills | Status: AC

## 2017-03-10 MED ORDER — FLUORESCEIN SODIUM 0.6 MG OP STRP: 1.0000 | ORAL_STRIP | Freq: Once | OPHTHALMIC | Status: DC

## 2017-03-10 NOTE — Discharge Instructions (Signed)
°  [facility] °[email or web address] °[address] °[place], [place] °[phone]  ° ° °[facility] °[email or web address] °[address] °[place], [place] °[phone]  ° ° °[facility] °[address] °[place], [place] °[phone]  ° °[facility] °[email or web address] °[address] °[place], [place] °[phone]  ° °[facility] °[email or web address] °[address] °[place], [place] °[phone]  ° °[facility] °[email or web address] °[address] °[place], [place] °[phone] ° °

## 2017-03-10 NOTE — ED Notes (Signed)
Pelvic cart at bedside. 

## 2017-03-10 NOTE — ED Notes (Signed)
Woods lamp at bedside.  

## 2017-03-10 NOTE — ED Provider Notes (Signed)
TIME SEEN: 1:01 AM  CHIEF COMPLAINT: "I have a busted blood vessel in my left eye", vaginal itching  HPI: Pt is a 26 y.o. G1 P0 A1 who presents emergency department 2 separate complaints. Reports approximately 1 week ago she noticed that she had a "busted blood vessel" to the left eye. She has a subconjunctival hemorrhage noted. This was not dramatic. She does not recall what incited this event. No changes in her vision but she has been rubbing the eye and now feels like there may be something in her eye. She does not work contacts or glasses. No history of eye surgeries. Does not use eyedrops. No discharge from the eye. No headache, vomiting.   She also complains of several days of vaginal itching and white vaginal discharge. No significant odor. She is sexually active with one female partner. No previous history of STDs. Her last menstrual cycle was 2 weeks ago. No dysuria, hematuria, abdominal pain, vomiting, diarrhea, fever. She is requesting STD check here in the emergency department.  ROS: See HPI Constitutional: no fever  Eyes: no drainage  ENT: no runny nose   Cardiovascular:  no chest pain  Resp: no SOB  GI: no vomiting GU: no dysuria Integumentary: no rash  Allergy: no hives  Musculoskeletal: no leg swelling  Neurological: no slurred speech ROS otherwise negative  PAST MEDICAL HISTORY/PAST SURGICAL HISTORY:  History reviewed. No pertinent past medical history.  MEDICATIONS:  Prior to Admission medications   Medication Sig Start Date End Date Taking? Authorizing Provider  cyclobenzaprine (FLEXERIL) 5 MG tablet Take 2 tablets (10 mg total) by mouth 3 (three) times daily as needed for muscle spasms. 05/14/14   Elwin MochaWalden, Blair, MD  HYDROcodone-acetaminophen (NORCO/VICODIN) 5-325 MG per tablet Take 1 tablet by mouth every 6 (six) hours as needed for moderate pain. 05/14/14   Elwin MochaWalden, Blair, MD  metroNIDAZOLE (FLAGYL) 500 MG tablet Take 1 tablet (500 mg total) by mouth 2 (two) times  daily. 12/01/14   Pisciotta, Joni ReiningNicole, PA-C  Prenatal Vit-Fe Fumarate-FA (PRENATAL COMPLETE) 14-0.4 MG TABS Take 1 tablet by mouth daily. 12/01/14   Pisciotta, Joni ReiningNicole, PA-C    ALLERGIES:  No Known Allergies  SOCIAL HISTORY:  Social History  Substance Use Topics  . Smoking status: Never Smoker  . Smokeless tobacco: Never Used  . Alcohol use No    FAMILY HISTORY: No family history on file.  EXAM: BP 127/80 (BP Location: Right Arm)   Pulse 87   Temp 98.7 F (37.1 C) (Oral)   Resp 15   Ht 4\' 11"  (1.499 m)   Wt 59 kg (130 lb)   LMP 02/24/2017   SpO2 100%   BMI 26.26 kg/m  CONSTITUTIONAL: Alert and oriented and responds appropriately to questions. Well-appearing; well-nourished HEAD: Normocephalic EYES: Conjunctivae clear, pupils appear equal, EOMI, Normal visual fields, normal visual acuity, no papilledema, no photophobia, no pain with consensual light response, patient has a subconjunctival hemorrhage noted to the left eye, no hyphema, no foreign body appreciated, no corneal abrasion or corneal ulcer seen with fluoroscein staining of the eye ENT: normal nose; moist mucous membranes NECK: Supple, no meningismus, no nuchal rigidity, no LAD  CARD: RRR; S1 and S2 appreciated; no murmurs, no clicks, no rubs, no gallops RESP: Normal chest excursion without splinting or tachypnea; breath sounds clear and equal bilaterally; no wheezes, no rhonchi, no rales, no hypoxia or respiratory distress, speaking full sentences ABD/GI: Normal bowel sounds; non-distended; soft, non-tender, no rebound, no guarding, no peritoneal signs, no hepatosplenomegaly  GU:  Normal external genitalia. No lesions, rashes noted. Patient has no vaginal bleeding on exam. Mild thin white vaginal discharge.  No adnexal tenderness, mass or fullness, no cervical motion tenderness. Cervix is not appear friable.  Cervix is closed. Patient has a piercing through her clitoral hood that does not appear infected and there is no drainage  or bleeding. Chaperone present for exam. BACK:  The back appears normal and is non-tender to palpation, there is no CVA tenderness EXT: Normal ROM in all joints; non-tender to palpation; no edema; normal capillary refill; no cyanosis, no calf tenderness or swelling    SKIN: Normal color for age and race; warm; no rash NEURO: Moves all extremities equally PSYCH: The patient's mood and manner are appropriate. Grooming and personal hygiene are appropriate.  MEDICAL DECISION MAKING: Patient here with subconjunctival hemorrhage. There is no foreign body, corneal abrasion or ulceration. She has normal visual acuity. Doubt glaucoma.  No hyphema. She has no sign of infection of the thigh and no discharge. Discussed with her that she does not need any medications to improve this as it will get better with time.   Patient also complaining of vaginal itching and discharge. Wet prep is positive for clue cells. We'll treat her with Flagyl twice a day for 1 week. Urine shows no other sign of infection and she is not pregnant. STD screening is pending but at this time I do not feel she needs prophylactic treatment. Discussed with her that she will be contacted if any of her results are positive and that at that time she and her partner would need to be treated.   Nothing on exam at this time to suggest significant ovarian cyst, torsion, PID, ectopic, TOA. I do not feel she needs emergent imaging. Given outpatient OB/GYN follow-up information.    At this time, I do not feel there is any life-threatening condition present. I have reviewed and discussed all results (EKG, imaging, lab, urine as appropriate) and exam findings with patient/family. I have reviewed nursing notes and appropriate previous records.  I feel the patient is safe to be discharged home without further emergent workup and can continue workup as an outpatient as needed. Discussed usual and customary return precautions. Patient/family verbalize  understanding and are comfortable with this plan.  Outpatient follow-up has been provided if needed. All questions have been answered.        Ward, Layla Maw, DO 03/10/17 [phone]

## 2017-03-10 NOTE — ED Triage Notes (Signed)
PT reports one week history of a "busted blood vessel" to the left eye. Denies injury, however has foreign body sensation. Also requesting STI check for vaginal itching that has been present for a "few days."

## 2017-03-10 NOTE — ED Notes (Signed)
Pt discharged to home NAD.  

## 2017-03-11 LAB — GC/CHLAMYDIA PROBE AMP (~~LOC~~) NOT AT ARMC
Chlamydia: NEGATIVE
Neisseria Gonorrhea: NEGATIVE

## 2017-03-11 LAB — RPR: RPR: NONREACTIVE

## 2017-03-11 LAB — HIV ANTIBODY (ROUTINE TESTING W REFLEX): HIV Screen 4th Generation wRfx: NONREACTIVE

## 2023-05-05 ENCOUNTER — Inpatient Hospital Stay (HOSPITAL_COMMUNITY): Admission: AD | Admit: 2023-05-05 | Payer: Medicaid Other | Source: Home / Self Care | Admitting: Family Medicine
